# Patient Record
Sex: Male | Born: 1960
Health system: Southern US, Community
[De-identification: ages and names within clinical notes are randomized; demographics above are authoritative.]

## PROBLEM LIST (undated history)

## (undated) DIAGNOSIS — R519 Headache, unspecified: Secondary | ICD-10-CM

## (undated) DIAGNOSIS — J449 Chronic obstructive pulmonary disease, unspecified: Secondary | ICD-10-CM

## (undated) DIAGNOSIS — J3489 Other specified disorders of nose and nasal sinuses: Secondary | ICD-10-CM

## (undated) DIAGNOSIS — T8859XA Other complications of anesthesia, initial encounter: Secondary | ICD-10-CM

## (undated) DIAGNOSIS — M1990 Unspecified osteoarthritis, unspecified site: Secondary | ICD-10-CM

## (undated) HISTORY — PX: NO PAST SURGERIES: SHX2092

---

## 2016-02-26 DIAGNOSIS — T754XXA Electrocution, initial encounter: Secondary | ICD-10-CM

## 2016-02-26 HISTORY — DX: Electrocution, initial encounter: T75.4XXA

## 2018-02-02 ENCOUNTER — Other Ambulatory Visit: Payer: Self-pay | Admitting: Student

## 2018-02-02 DIAGNOSIS — R1031 Right lower quadrant pain: Secondary | ICD-10-CM

## 2018-02-03 ENCOUNTER — Ambulatory Visit
Admission: RE | Admit: 2018-02-03 | Discharge: 2018-02-03 | Disposition: A | Discharge status: Home / Self Care | Payer: No Typology Code available for payment source | Source: Ambulatory Visit | Attending: Student | Admitting: Student

## 2018-02-03 DIAGNOSIS — R1031 Right lower quadrant pain: Secondary | ICD-10-CM | POA: Diagnosis present

## 2018-02-13 ENCOUNTER — Other Ambulatory Visit: Payer: Self-pay | Admitting: General Surgery

## 2018-02-13 DIAGNOSIS — R1031 Right lower quadrant pain: Secondary | ICD-10-CM

## 2018-02-20 ENCOUNTER — Ambulatory Visit: Payer: No Typology Code available for payment source

## 2018-02-26 ENCOUNTER — Ambulatory Visit
Admission: RE | Admit: 2018-02-26 | Discharge: 2018-02-26 | Disposition: A | Discharge status: Home / Self Care | Payer: Commercial Managed Care - HMO | Source: Ambulatory Visit | Attending: General Surgery | Admitting: General Surgery

## 2018-02-26 DIAGNOSIS — R1031 Right lower quadrant pain: Secondary | ICD-10-CM | POA: Insufficient documentation

## 2019-07-17 IMAGING — US US EXTREM LOW*R* LIMITED
1 series · 14 of 25 positions shown · non-contrast
Comparison: None.

CLINICAL DATA: Right groin/inguinal pain for the past 4 months.

EXAM:
ULTRASOUND RIGHT LOWER EXTREMITY LIMITED
TECHNIQUE: Ultrasound examination of the lower extremity soft tissues was
performed in the area of clinical concern.

[Series 1: us extrem low*right* limited · 0.06mm/px · 14 of 36 slices shown]
[im 1/36]
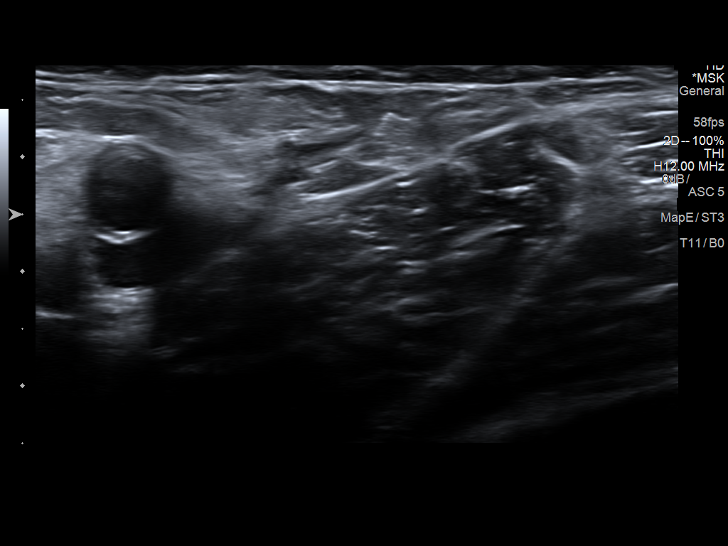
[im 3/36]
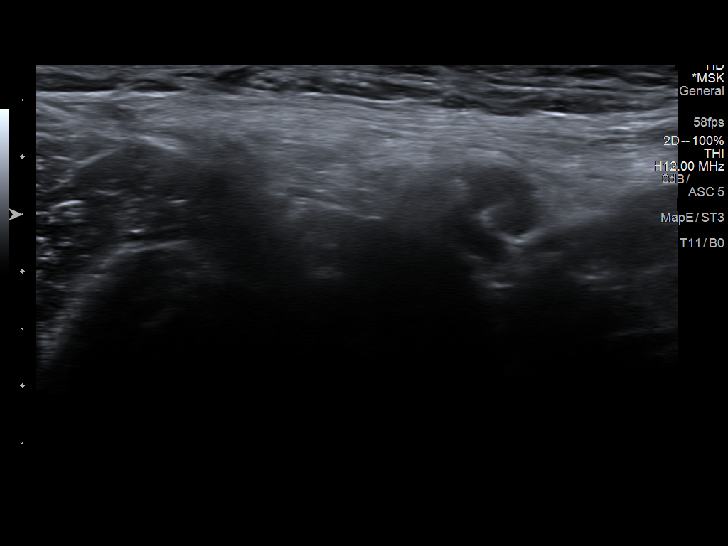
[im 6/36]
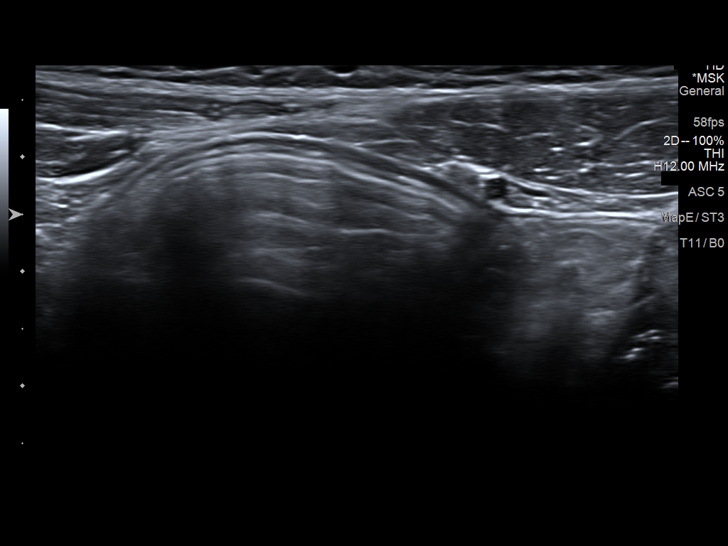
[im 9/36]
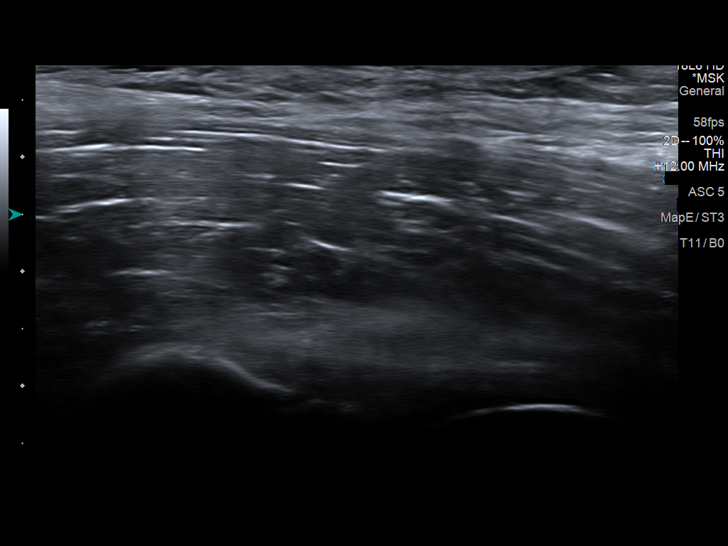
[im 12/36]
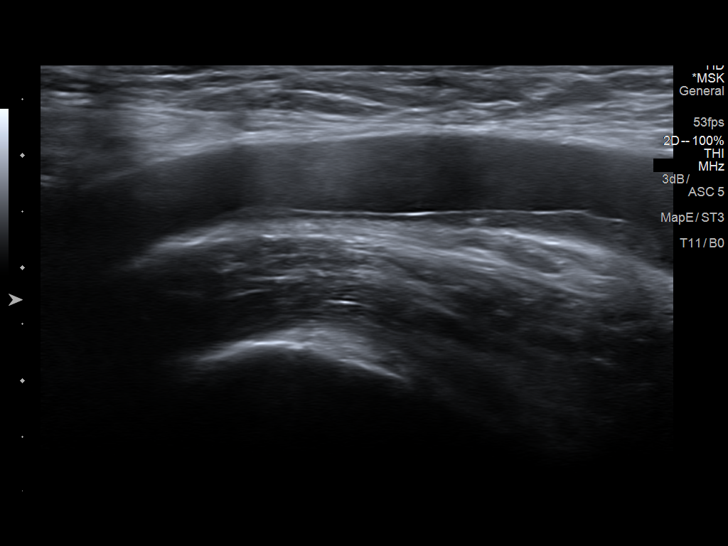
[im 14/36]
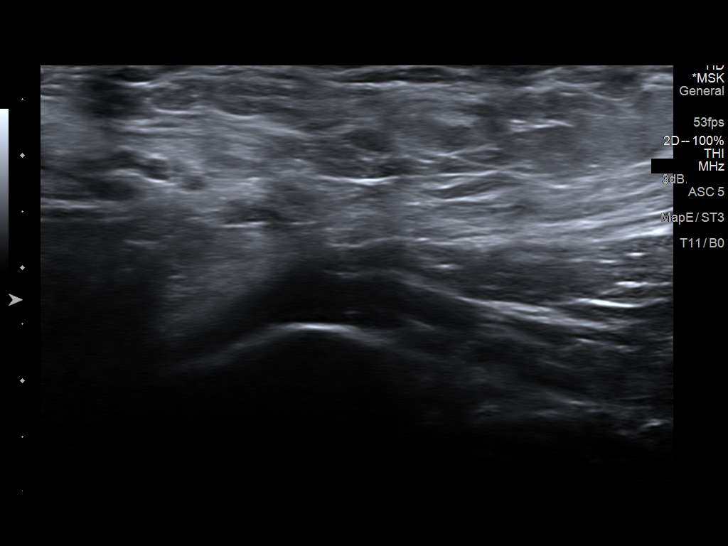
[im 17/36]
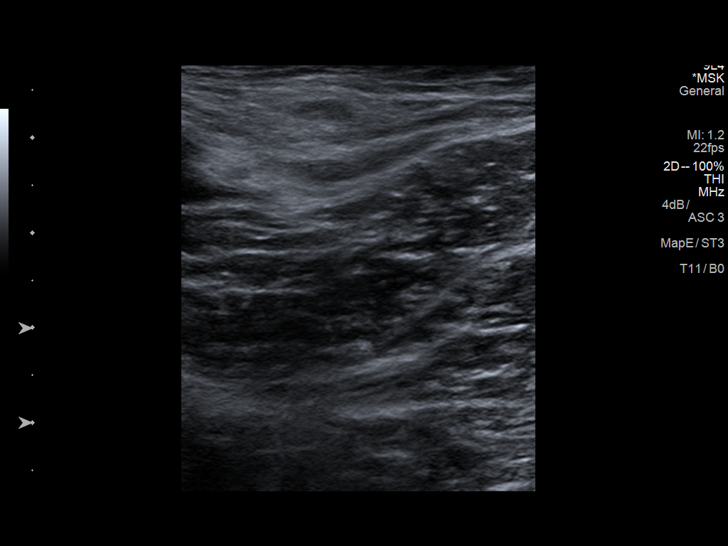
[im 19/36]
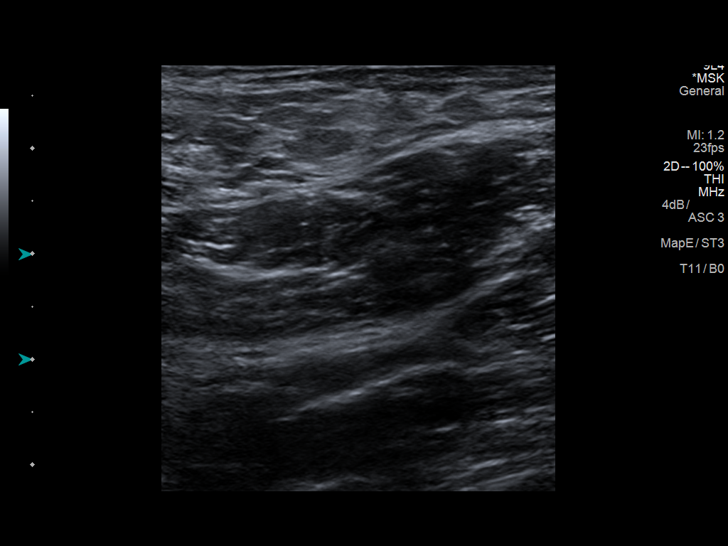
[im 22/36]
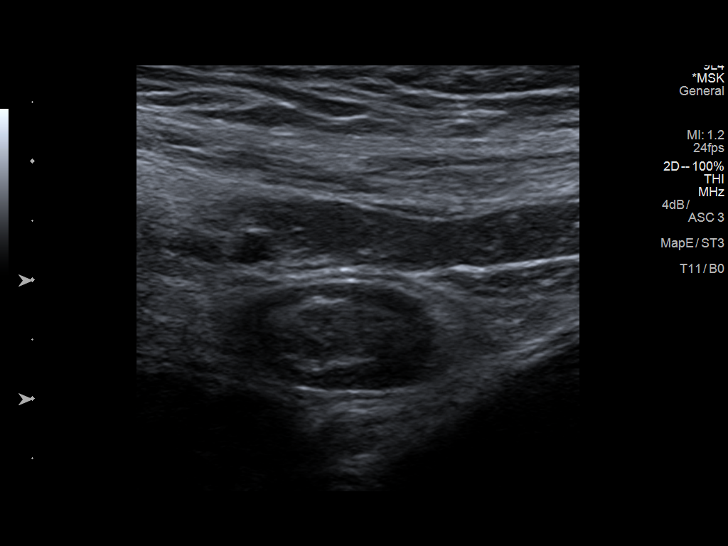
[im 24/36]
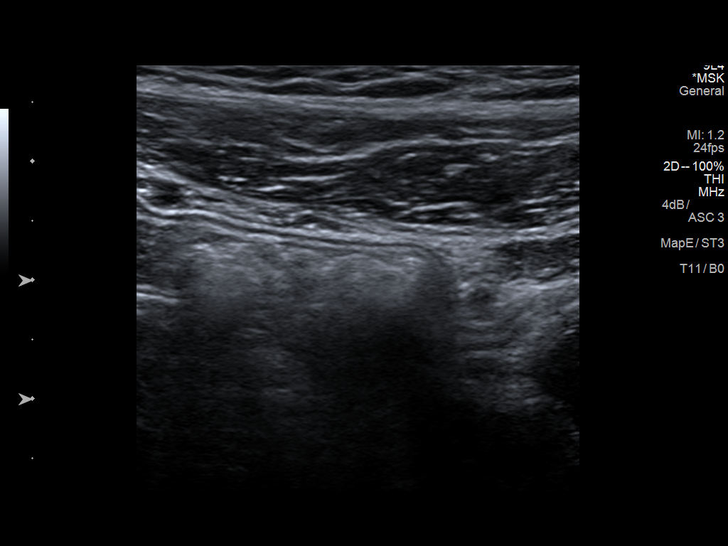
[im 27/36]
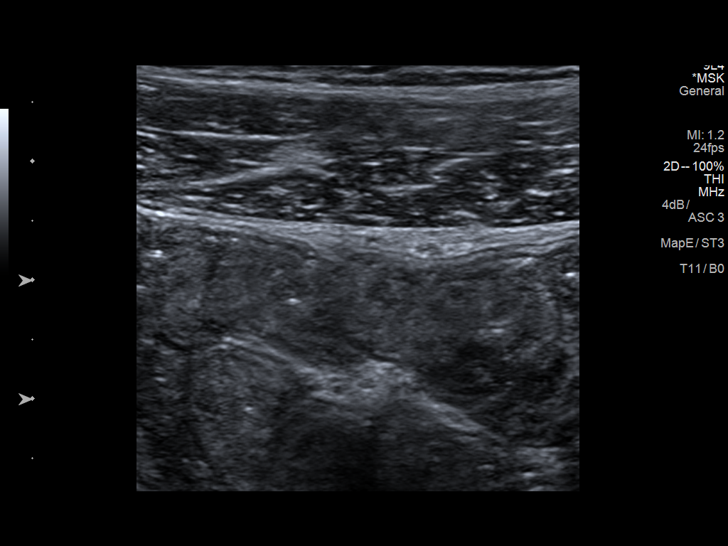
[im 30/36]
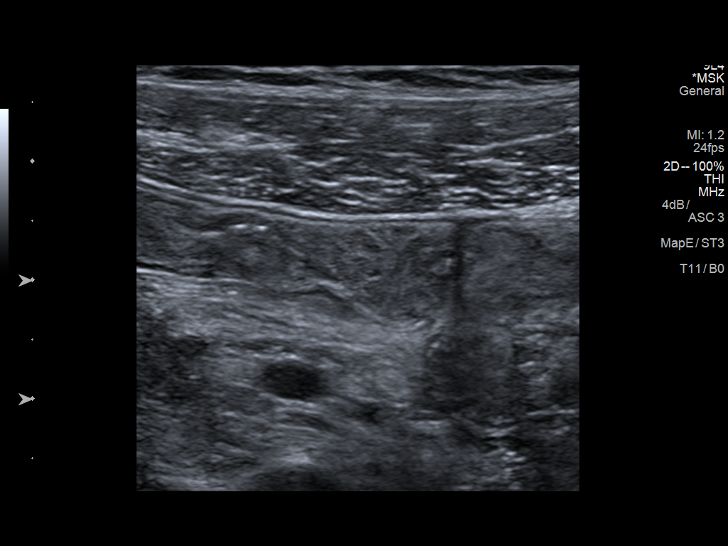
[im 33/36]
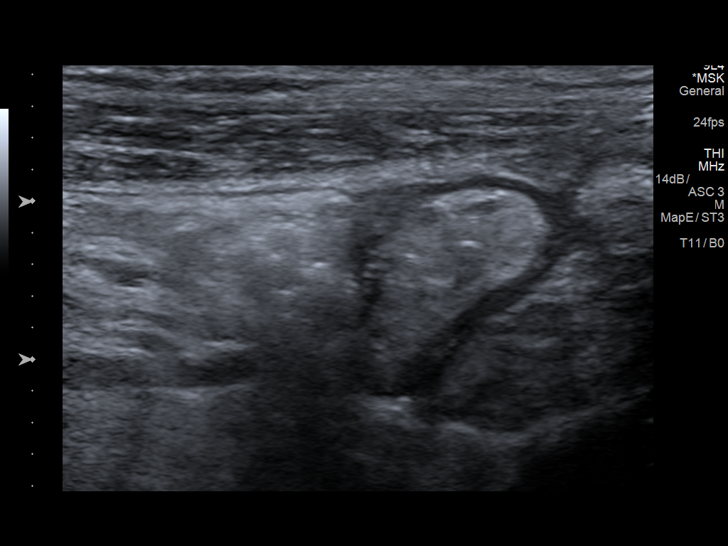
[im 36/36]
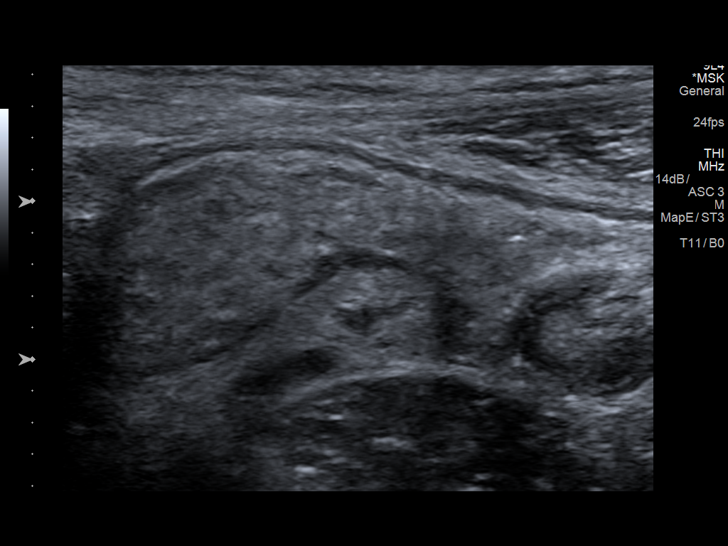

[14 of 25 positions shown; findings below may reference images not displayed]

FINDINGS: On the images, there is a suggestion of a right inguinal hernia
containing bowel. However, the technologist reported that the groin
had a normal appearance at real-time with no hernia seen. Normal
appearing right inguinal lymph nodes are demonstrated. A small
amount of atheromatous plaque and calcification is noted in right
common femoral artery.
IMPRESSION: 1. No sonographically visible hernia. If a hernia remains a clinical
concern, a pelvis CT without contrast would be recommended.
2. No visible mass or adenopathy.
3. Right common femoral artery atheromatous changes.

## 2019-08-27 ENCOUNTER — Other Ambulatory Visit: Payer: Self-pay | Admitting: Otolaryngology

## 2019-08-27 DIAGNOSIS — R221 Localized swelling, mass and lump, neck: Secondary | ICD-10-CM

## 2019-09-10 ENCOUNTER — Other Ambulatory Visit: Payer: Self-pay

## 2019-09-10 ENCOUNTER — Ambulatory Visit
Admission: RE | Admit: 2019-09-10 | Discharge: 2019-09-10 | Disposition: A | Discharge status: Home / Self Care | Payer: BC Managed Care – PPO | Source: Ambulatory Visit | Attending: Otolaryngology | Admitting: Otolaryngology

## 2019-09-10 DIAGNOSIS — R221 Localized swelling, mass and lump, neck: Secondary | ICD-10-CM | POA: Insufficient documentation

## 2019-09-10 MED ORDER — IOHEXOL 300 MG/ML  SOLN
75.0000 mL | Freq: Once | INTRAMUSCULAR | Status: AC | PRN
  Administered 2019-09-10: 75 mL via INTRAVENOUS

## 2019-09-15 ENCOUNTER — Other Ambulatory Visit: Payer: Self-pay | Admitting: Otolaryngology

## 2019-09-15 DIAGNOSIS — K119 Disease of salivary gland, unspecified: Secondary | ICD-10-CM

## 2019-09-21 ENCOUNTER — Other Ambulatory Visit: Payer: Self-pay | Admitting: Physician Assistant

## 2019-09-22 ENCOUNTER — Other Ambulatory Visit: Payer: Self-pay

## 2019-09-22 ENCOUNTER — Ambulatory Visit
Admission: RE | Admit: 2019-09-22 | Discharge: 2019-09-22 | Disposition: A | Discharge status: Home / Self Care | Payer: BC Managed Care – PPO | Source: Ambulatory Visit | Attending: Otolaryngology | Admitting: Otolaryngology

## 2019-09-22 DIAGNOSIS — K118 Other diseases of salivary glands: Secondary | ICD-10-CM | POA: Insufficient documentation

## 2019-09-22 DIAGNOSIS — K119 Disease of salivary gland, unspecified: Secondary | ICD-10-CM

## 2019-09-22 MED ORDER — SODIUM CHLORIDE 0.9 % IV SOLN: INTRAVENOUS | Status: DC

## 2019-09-22 NOTE — Discharge Instructions (Signed)

## 2019-09-22 NOTE — Procedures (Signed)
Interventional Radiology Procedure Note  Procedure:  US guided right parotid mass FNA.  Findings: 4 passes determined to be diagnostic.   Complications: None  Recommendations:  - Ok to shower tomorrow - Do not submerge for 7 days - Routine care - dc home now   Signed,  Dulcy Fanny. Earleen Newport, DO

## 2019-09-24 LAB — CYTOLOGY - NON PAP

## 2019-10-18 ENCOUNTER — Encounter
Admission: RE | Admit: 2019-10-18 | Discharge: 2019-10-18 | Disposition: A | Discharge status: Home / Self Care | Payer: BC Managed Care – PPO | Source: Ambulatory Visit | Attending: Otolaryngology | Admitting: Otolaryngology

## 2019-10-18 ENCOUNTER — Other Ambulatory Visit: Payer: Self-pay

## 2019-10-18 DIAGNOSIS — Z01818 Encounter for other preprocedural examination: Secondary | ICD-10-CM | POA: Diagnosis not present

## 2019-10-18 DIAGNOSIS — J449 Chronic obstructive pulmonary disease, unspecified: Secondary | ICD-10-CM | POA: Insufficient documentation

## 2019-10-18 HISTORY — DX: Other complications of anesthesia, initial encounter: T88.59XA

## 2019-10-18 HISTORY — DX: Chronic obstructive pulmonary disease, unspecified: J44.9

## 2019-10-18 HISTORY — DX: Other specified disorders of nose and nasal sinuses: J34.89

## 2019-10-18 HISTORY — DX: Unspecified osteoarthritis, unspecified site: M19.90

## 2019-10-18 HISTORY — DX: Headache, unspecified: R51.9

## 2019-10-18 NOTE — Patient Instructions (Addendum)
Your procedure is scheduled on: 10-27-19 [facility] Report to Same Day Surgery 2nd floor medical mall [facility] Entrance-take elevator on left to 2nd floor.  Check in with surgery information desk.) To find out your arrival time please call [phone] between 1PM - 3PM on 10-26-19 TUESDAY  Remember: Instructions that are not followed completely may result in serious medical risk, up to and including death, or upon the discretion of your surgeon and anesthesiologist your surgery may need to be rescheduled.    _x___ 1. Do not eat food after midnight the night before your procedure. NO GUM OR CANDY AFTER MIDNIGHT. You may drink clear liquids up to 2 hours before you are scheduled to arrive at the hospital for your procedure.  Do not drink clear liquids within 2 hours of your scheduled arrival to the hospital.  Clear liquids include  --Water or Apple juice without pulp  --Gatorade  --Black Coffee or Clear Tea (No milk, no creamers, do not add anything to the coffee or Tea-OK TO ADD SUGAR)      __x__ 2. No Alcohol for 24 hours before or after surgery.   __x__3. No Smoking or e-cigarettes for 24 prior to surgery.  Do not use any chewable tobacco products for at least 6 hour prior to surgery   ____  4. Bring all medications with you on the day of surgery if instructed.    __x__ 5. Notify your doctor if there is any change in your medical condition     (cold, fever, infections).    x___6. On the morning of surgery brush your teeth with toothpaste and water.  You may rinse your mouth with mouth wash if you wish.  Do not swallow any toothpaste or mouthwash.   Do not wear jewelry, make-up, hairpins, clips or nail polish.  Do not wear lotions, powders, or perfumes.   Do not shave 48 hours prior to surgery. Men may shave face and neck.  Do not bring valuables to the hospital.    [facility] is not responsible for any belongings or valuables.               Contacts, dentures or bridgework may  not be worn into surgery.  Leave your suitcase in the car. After surgery it may be brought to your room.  For patients admitted to the hospital, discharge time is determined by your treatment team.  _  Patients discharged the day of surgery will not be allowed to drive home.  You will need someone to drive you home and stay with you the night of your procedure.    Please read over the following fact sheets that you were given:   [facility] Preparing for Surgery   _x___ TAKE THE FOLLOWING MEDICATION THE MORNING OF SURGERY WITH A SMALL SIP OF WATER. These include:  1. MUCINEX (GUAIFNESEN)  2.  3.  4.  5.  6.  ____Fleets enema or Magnesium Citrate as directed.   _x___ Use CHG Soap as directed on instruction sheet   _X___ Use inhalers on the day of surgery and bring to hospital day of [place]   ____ Stop Metformin and Janumet 2 days prior to surgery.    ____ Take 1/2 of usual insulin dose the night before surgery and none on the morning surgery.   ____ Follow recommendations from Cardiologist, Pulmonologist or PCP regarding stopping Aspirin, Coumadin, Plavix ,Eliquis, Effient, or Pradaxa,  and Pletal.  X____Stop Anti-inflammatories such as Advil, Aleve, Ibuprofen, Motrin, Naproxen, Naprosyn, Goodies powders or aspirin products NOW- OK to take Tylenol   ____ Stop supplements until after surgery   ____ Bring C-Pap to the hospital.

## 2019-10-19 ENCOUNTER — Encounter
Admission: RE | Admit: 2019-10-19 | Discharge: 2019-10-19 | Disposition: A | Discharge status: Home / Self Care | Payer: BC Managed Care – PPO | Source: Ambulatory Visit | Attending: Otolaryngology | Admitting: Otolaryngology

## 2019-10-19 DIAGNOSIS — Z01810 Encounter for preprocedural cardiovascular examination: Secondary | ICD-10-CM | POA: Diagnosis present

## 2019-10-19 DIAGNOSIS — Z01812 Encounter for preprocedural laboratory examination: Secondary | ICD-10-CM | POA: Insufficient documentation

## 2019-10-19 DIAGNOSIS — J449 Chronic obstructive pulmonary disease, unspecified: Secondary | ICD-10-CM | POA: Insufficient documentation

## 2019-10-19 LAB — CBC
HCT: 44.3 % (ref 39.0–52.0)
Hemoglobin: 15.8 g/dL (ref 13.0–17.0)
MCH: 32.8 pg (ref 26.0–34.0)
MCHC: 35.7 g/dL (ref 30.0–36.0)
MCV: 91.9 fL (ref 80.0–100.0)
Platelets: 229 10*3/uL (ref 150–400)
RBC: 4.82 MIL/uL (ref 4.22–5.81)
RDW: 13.1 % (ref 11.5–15.5)
WBC: 6.6 10*3/uL (ref 4.0–10.5)
nRBC: 0 % (ref 0.0–0.2)

## 2019-10-25 ENCOUNTER — Other Ambulatory Visit: Payer: Self-pay

## 2019-10-25 ENCOUNTER — Other Ambulatory Visit
Admission: RE | Admit: 2019-10-25 | Discharge: 2019-10-25 | Disposition: A | Discharge status: Home / Self Care | Payer: BC Managed Care – PPO | Source: Ambulatory Visit | Attending: Otolaryngology | Admitting: Otolaryngology

## 2019-10-25 DIAGNOSIS — Z01812 Encounter for preprocedural laboratory examination: Secondary | ICD-10-CM | POA: Diagnosis not present

## 2019-10-25 DIAGNOSIS — Z20822 Contact with and (suspected) exposure to covid-19: Secondary | ICD-10-CM | POA: Insufficient documentation

## 2019-10-25 LAB — SARS CORONAVIRUS 2 (TAT 6-24 HRS): SARS Coronavirus 2: NEGATIVE

## 2019-10-27 ENCOUNTER — Other Ambulatory Visit: Payer: Self-pay

## 2019-10-27 ENCOUNTER — Ambulatory Visit: Payer: BC Managed Care – PPO | Admitting: Anesthesiology

## 2019-10-27 ENCOUNTER — Encounter: Payer: Self-pay | Admitting: Otolaryngology

## 2019-10-27 ENCOUNTER — Ambulatory Visit
Admission: RE | Admit: 2019-10-27 | Discharge: 2019-10-27 | Disposition: A | Discharge status: Home / Self Care | Payer: BC Managed Care – PPO | Source: Ambulatory Visit | Attending: Otolaryngology | Admitting: Otolaryngology

## 2019-10-27 ENCOUNTER — Encounter
Admission: RE | Disposition: A | Discharge status: Home / Self Care | Payer: Self-pay | Source: Ambulatory Visit | Attending: Otolaryngology

## 2019-10-27 DIAGNOSIS — J449 Chronic obstructive pulmonary disease, unspecified: Secondary | ICD-10-CM | POA: Diagnosis not present

## 2019-10-27 DIAGNOSIS — G43909 Migraine, unspecified, not intractable, without status migrainosus: Secondary | ICD-10-CM | POA: Diagnosis not present

## 2019-10-27 DIAGNOSIS — F17200 Nicotine dependence, unspecified, uncomplicated: Secondary | ICD-10-CM | POA: Diagnosis not present

## 2019-10-27 DIAGNOSIS — D110 Benign neoplasm of parotid gland: Secondary | ICD-10-CM | POA: Diagnosis not present

## 2019-10-27 DIAGNOSIS — M1990 Unspecified osteoarthritis, unspecified site: Secondary | ICD-10-CM | POA: Insufficient documentation

## 2019-10-27 DIAGNOSIS — Z79899 Other long term (current) drug therapy: Secondary | ICD-10-CM | POA: Insufficient documentation

## 2019-10-27 DIAGNOSIS — D490 Neoplasm of unspecified behavior of digestive system: Secondary | ICD-10-CM | POA: Diagnosis present

## 2019-10-27 HISTORY — PX: PAROTIDECTOMY: SHX2163

## 2019-10-27 SURGERY — EXCISION, PAROTID GLAND
Anesthesia: General | Laterality: Right

## 2019-10-27 MED ORDER — REMIFENTANIL HCL 1 MG IV SOLR
INTRAVENOUS | Status: AC
  Filled 2019-10-27: qty 1000

## 2019-10-27 MED ORDER — LIDOCAINE-EPINEPHRINE (PF) 1 %-1:200000 IJ SOLN
INTRAMUSCULAR | Status: AC
  Filled 2019-10-27: qty 30

## 2019-10-27 MED ORDER — PROPOFOL 500 MG/50ML IV EMUL
INTRAVENOUS | Status: AC
  Filled 2019-10-27: qty 50

## 2019-10-27 MED ORDER — ONDANSETRON HCL 4 MG/2ML IJ SOLN: 4.0000 mg | Freq: Once | INTRAMUSCULAR | Status: DC | PRN

## 2019-10-27 MED ORDER — REMIFENTANIL HCL 1 MG IV SOLR
INTRAVENOUS | Status: AC
Start: 2019-10-27 — End: ?
  Filled 2019-10-27: qty 1000

## 2019-10-27 MED ORDER — DEXAMETHASONE SODIUM PHOSPHATE 10 MG/ML IJ SOLN
INTRAMUSCULAR | Status: AC
  Filled 2019-10-27: qty 1

## 2019-10-27 MED ORDER — MIDAZOLAM HCL 2 MG/2ML IJ SOLN
INTRAMUSCULAR | Status: DC | PRN
  Administered 2019-10-27: 2 mg via INTRAVENOUS

## 2019-10-27 MED ORDER — FENTANYL CITRATE (PF) 100 MCG/2ML IJ SOLN
INTRAMUSCULAR | Status: DC | PRN
Start: 2019-10-27 — End: 2019-10-27
  Administered 2019-10-27 (×2): 50 ug via INTRAVENOUS

## 2019-10-27 MED ORDER — SUCCINYLCHOLINE CHLORIDE 200 MG/10ML IV SOSY
PREFILLED_SYRINGE | INTRAVENOUS | Status: AC
  Filled 2019-10-27: qty 10

## 2019-10-27 MED ORDER — OXYCODONE HCL 5 MG/5ML PO SOLN: 5.0000 mg | Freq: Once | ORAL | Status: AC | PRN

## 2019-10-27 MED ORDER — OXYCODONE HCL 5 MG PO TABS
5.0000 mg | ORAL_TABLET | Freq: Once | ORAL | Status: AC | PRN
  Administered 2019-10-27: 5 mg via ORAL

## 2019-10-27 MED ORDER — SODIUM CHLORIDE (PF) 0.9 % IJ SOLN
INTRAMUSCULAR | Status: AC
  Filled 2019-10-27: qty 20

## 2019-10-27 MED ORDER — DEXMEDETOMIDINE HCL IN NACL 80 MCG/20ML IV SOLN
INTRAVENOUS | Status: AC
  Filled 2019-10-27: qty 20

## 2019-10-27 MED ORDER — FENTANYL CITRATE (PF) 100 MCG/2ML IJ SOLN
INTRAMUSCULAR | Status: AC
  Filled 2019-10-27: qty 2

## 2019-10-27 MED ORDER — PROPOFOL 10 MG/ML IV BOLUS
INTRAVENOUS | Status: AC
  Filled 2019-10-27: qty 20

## 2019-10-27 MED ORDER — SUCCINYLCHOLINE CHLORIDE 20 MG/ML IJ SOLN
INTRAMUSCULAR | Status: DC | PRN
  Administered 2019-10-27: 100 mg via INTRAVENOUS

## 2019-10-27 MED ORDER — KETAMINE HCL 50 MG/ML IJ SOLN
INTRAMUSCULAR | Status: AC
  Filled 2019-10-27: qty 10

## 2019-10-27 MED ORDER — MIDAZOLAM HCL 2 MG/2ML IJ SOLN
INTRAMUSCULAR | Status: AC
  Filled 2019-10-27: qty 2

## 2019-10-27 MED ORDER — PROPOFOL 500 MG/50ML IV EMUL
INTRAVENOUS | Status: DC | PRN
  Administered 2019-10-27: 150 ug/kg/min via INTRAVENOUS

## 2019-10-27 MED ORDER — EPHEDRINE SULFATE 50 MG/ML IJ SOLN
INTRAMUSCULAR | Status: DC | PRN
  Administered 2019-10-27: 5 mg via INTRAVENOUS
  Administered 2019-10-27 (×2): 10 mg via INTRAVENOUS
  Administered 2019-10-27: 5 mg via INTRAVENOUS

## 2019-10-27 MED ORDER — FAMOTIDINE 20 MG PO TABS: 20.0000 mg | ORAL_TABLET | Freq: Once | ORAL | Status: DC

## 2019-10-27 MED ORDER — PHENYLEPHRINE HCL (PRESSORS) 10 MG/ML IV SOLN
INTRAVENOUS | Status: DC | PRN
  Administered 2019-10-27: 100 ug via INTRAVENOUS
  Administered 2019-10-27: 80 ug via INTRAVENOUS

## 2019-10-27 MED ORDER — PROPOFOL 10 MG/ML IV BOLUS
INTRAVENOUS | Status: DC | PRN
  Administered 2019-10-27: 200 mg via INTRAVENOUS
  Administered 2019-10-27: 50 mg via INTRAVENOUS

## 2019-10-27 MED ORDER — PROPOFOL 10 MG/ML IV BOLUS
INTRAVENOUS | Status: AC
  Filled 2019-10-27: qty 40

## 2019-10-27 MED ORDER — ORAL CARE MOUTH RINSE: 15.0000 mL | Freq: Once | OROMUCOSAL | Status: DC

## 2019-10-27 MED ORDER — PHENYLEPHRINE HCL (PRESSORS) 10 MG/ML IV SOLN
INTRAVENOUS | Status: AC
  Filled 2019-10-27: qty 1

## 2019-10-27 MED ORDER — ONDANSETRON HCL 4 MG/2ML IJ SOLN
INTRAMUSCULAR | Status: AC
  Filled 2019-10-27: qty 2

## 2019-10-27 MED ORDER — LIDOCAINE-EPINEPHRINE (PF) 1 %-1:200000 IJ SOLN
INTRAMUSCULAR | Status: DC | PRN
  Administered 2019-10-27: 6 mL

## 2019-10-27 MED ORDER — BACITRACIN ZINC 500 UNIT/GM EX OINT
TOPICAL_OINTMENT | CUTANEOUS | Status: DC | PRN
  Administered 2019-10-27: 1 via TOPICAL

## 2019-10-27 MED ORDER — GLYCOPYRROLATE 0.2 MG/ML IJ SOLN
INTRAMUSCULAR | Status: AC
  Filled 2019-10-27: qty 1

## 2019-10-27 MED ORDER — LIDOCAINE HCL (PF) 2 % IJ SOLN
INTRAMUSCULAR | Status: AC
  Filled 2019-10-27: qty 5

## 2019-10-27 MED ORDER — CHLORHEXIDINE GLUCONATE 0.12 % MT SOLN: 15.0000 mL | Freq: Once | OROMUCOSAL | Status: DC

## 2019-10-27 MED ORDER — LACTATED RINGERS IV SOLN: INTRAVENOUS | Status: DC

## 2019-10-27 MED ORDER — DEXAMETHASONE SODIUM PHOSPHATE 10 MG/ML IJ SOLN
INTRAMUSCULAR | Status: DC | PRN
  Administered 2019-10-27: 10 mg via INTRAVENOUS

## 2019-10-27 MED ORDER — GLYCOPYRROLATE 0.2 MG/ML IJ SOLN
INTRAMUSCULAR | Status: DC | PRN
  Administered 2019-10-27: .2 mg via INTRAVENOUS

## 2019-10-27 MED ORDER — ONDANSETRON HCL 4 MG/2ML IJ SOLN
INTRAMUSCULAR | Status: DC | PRN
  Administered 2019-10-27: 4 mg via INTRAVENOUS

## 2019-10-27 MED ORDER — FENTANYL CITRATE (PF) 100 MCG/2ML IJ SOLN: 25.0000 ug | INTRAMUSCULAR | Status: DC | PRN

## 2019-10-27 MED ORDER — OXYCODONE-ACETAMINOPHEN 10-325 MG PO TABS
1.0000 | ORAL_TABLET | Freq: Four times a day (QID) | ORAL | 0 refills | Status: AC | PRN
Start: 2019-10-27 — End: ?

## 2019-10-27 MED ORDER — LIDOCAINE HCL (CARDIAC) PF 100 MG/5ML IV SOSY
PREFILLED_SYRINGE | INTRAVENOUS | Status: DC | PRN
  Administered 2019-10-27: 100 mg via INTRAVENOUS

## 2019-10-27 MED ORDER — OXYCODONE HCL 5 MG PO TABS
ORAL_TABLET | ORAL | Status: AC
  Filled 2019-10-27: qty 1

## 2019-10-27 MED ORDER — DEXMEDETOMIDINE HCL 200 MCG/2ML IV SOLN
INTRAVENOUS | Status: DC | PRN
  Administered 2019-10-27: 8 ug via INTRAVENOUS

## 2019-10-27 MED ORDER — BACITRACIN ZINC 500 UNIT/GM EX OINT
TOPICAL_OINTMENT | CUTANEOUS | Status: AC
  Filled 2019-10-27: qty 28.35

## 2019-10-27 MED ORDER — ACETAMINOPHEN 10 MG/ML IV SOLN: 1000.0000 mg | Freq: Once | INTRAVENOUS | Status: DC | PRN

## 2019-10-27 MED ORDER — KETAMINE HCL 10 MG/ML IJ SOLN
INTRAMUSCULAR | Status: DC | PRN
  Administered 2019-10-27: 25 mg via INTRAVENOUS
  Administered 2019-10-27: 10 mg via INTRAVENOUS
  Administered 2019-10-27: 25 mg via INTRAVENOUS

## 2019-10-27 MED ORDER — REMIFENTANIL HCL 1 MG IV SOLR
INTRAVENOUS | Status: DC | PRN
  Administered 2019-10-27: .2 ug/kg/min via INTRAVENOUS

## 2019-10-27 SURGICAL SUPPLY — 40 items
ADH LQ OCL WTPRF AMP STRL LF (MISCELLANEOUS) ×1
ADHESIVE MASTISOL STRL (MISCELLANEOUS) ×3 IMPLANT
BLADE SURG 15 STRL LF DISP TIS (BLADE) ×1 IMPLANT
BLADE SURG 15 STRL SS (BLADE) ×3
BULB RESERV EVAC DRAIN JP 100C (MISCELLANEOUS) ×3 IMPLANT
CORD BIP STRL DISP 12FT (MISCELLANEOUS) ×3 IMPLANT
COVER WAND RF STERILE (DRAPES) ×3 IMPLANT
DRAIN JP 10F RND SILICONE (MISCELLANEOUS) ×3 IMPLANT
DRAPE MAG INST 16X20 L/F (DRAPES) ×3 IMPLANT
DRAPE SURG 17X11 SM STRL (DRAPES) ×6 IMPLANT
DRSG TEGADERM 2-3/8X2-3/4 SM (GAUZE/BANDAGES/DRESSINGS) ×12 IMPLANT
DRSG TEGADERM 4X4.75 (GAUZE/BANDAGES/DRESSINGS) ×3 IMPLANT
DRSG TELFA 4X3 1S NADH ST (GAUZE/BANDAGES/DRESSINGS) ×3 IMPLANT
ELECT EMG 20MM DUAL (MISCELLANEOUS) ×6
ELECT NEEDLE 20X.3 GREEN (MISCELLANEOUS) ×6
ELECT REM PT RETURN 9FT ADLT (ELECTROSURGICAL) ×3
ELECTRODE EMG 20MM DUAL (MISCELLANEOUS) ×2 IMPLANT
ELECTRODE NEEDLE 20X.3 GREEN (MISCELLANEOUS) ×2 IMPLANT
ELECTRODE REM PT RTRN 9FT ADLT (ELECTROSURGICAL) ×1 IMPLANT
FORCEPS JEWEL BIP 4-3/4 STR (INSTRUMENTS) ×3 IMPLANT
GAUZE SPONGE 4X4 12PLY STRL (GAUZE/BANDAGES/DRESSINGS) ×3 IMPLANT
GAUZE SPONGE 4X4 16PLY XRAY LF (GAUZE/BANDAGES/DRESSINGS) ×6 IMPLANT
GLOVE BIO SURGEON STRL SZ7.5 (GLOVE) ×6 IMPLANT
GLOVE SURG SYN 7.5  E (GLOVE) ×4
GLOVE SURG SYN 7.5 E (GLOVE) ×2 IMPLANT
GOWN STRL REUS W/ TWL LRG LVL3 (GOWN DISPOSABLE) ×3 IMPLANT
GOWN STRL REUS W/TWL LRG LVL3 (GOWN DISPOSABLE) ×9
HOOK STAY BLUNT/RETRACTOR 5M (MISCELLANEOUS) ×3 IMPLANT
KIT TURNOVER KIT A (KITS) ×3 IMPLANT
LABEL OR SOLS (LABEL) ×3 IMPLANT
PACK HEAD/NECK (MISCELLANEOUS) ×3 IMPLANT
PROBE MONO 100X0.75 ELECT 1.9M (MISCELLANEOUS) ×3 IMPLANT
SHEARS HARMONIC 9CM CVD (BLADE) ×3 IMPLANT
SPONGE KITTNER 5P (MISCELLANEOUS) ×9 IMPLANT
SUT PROLENE 5 0 PS 3 (SUTURE) ×3 IMPLANT
SUT SILK 2 0 (SUTURE) ×6
SUT SILK 2-0 18XBRD TIE 12 (SUTURE) ×2 IMPLANT
SUT SILK 4 0 (SUTURE) ×6
SUT SILK 4-0 18XBRD TIE 12 (SUTURE) ×2 IMPLANT
SUT VIC AB 4-0 RB1 18 (SUTURE) ×3 IMPLANT

## 2019-10-27 NOTE — Op Note (Signed)
10/27/2019  11:08 AM    Nicholas Phillips  [id number]   Pre-Op Diagnosis:  Right parotid neoplasm  Post-op Diagnosis: Right parotid neoplasm  Procedure:   Right Superficial Parotidectomy  Surgeon:  Riley Nearing  Assistant: Margaretha Sheffield  Anesthesia:  General endotracheal anesthesia  EBL:  Less than 37CH  Complications:  None  Findings: 2.5 cm right tail of parotid mass  Procedure: The patient was taken to the Operating Room and placed in the supine position.  After induction of general endotracheal anesthesia, the patient was turned 90 degrees and placed on a shoulder roll. The skin was injected along the proposed incision with 1% lidocaine with epinephrine, 1:200,000. The facial nerve monitor electrodes were placed in the usual fashion at the lower lip and brow on the same side of the procedure. Proper functioning of the nerve monitor was assessed. The area was then prepped and draped in the usual sterile fashion.   A 15 blade was then used to incise the skin from just in front of the right tragus, curving below the earlobe, and curving into the neck along an upper neck crease. The dissection was carried down to the subcutaneous tissues with the harmonic scalpel and through the platysma muscle, exposing the anterior belly of the sternocleidomastoid muscle. More superiorly the dissection proceeded along the tragal cartilage and down towards the mastoid tip. The dissection proceeded inferiorly to expose the digastric muscle. Next a skin flap was elevated just superficial to the fascia over the parotid gland, widely exposing the gland. Dissection then proceeded deeper, dissecting down to the region of the stylomastoid notch, dividing soft tissues with the Harmonic scalpel. The facial nerve was then identified at this point and confirmed with the nerve stimulator. Dissection then proceeded along the nerve anteriorly, identifying the pes and then dissecting along the superior and inferior  branches of the nerve, dividing parotid tissue above the nerve with the Harmonic scalpel. Intervening parotid tissue between branches was divided with the Harmonic scalpel. Once the dissection had proceeded anterior to the mass of abnormal parotid, the gland was then divided anterior to the mass, avoiding injury to the dissected nerve branches,  The wound was irrigated with saline and hemostasis obtained. A #10 TLS drain was placed through a separate stab incision in the skin posterior and inferior to the incision in the neck, and secured with a 34-0 Vicryl suture. The subcutaneous tissues were then closed with 4-0 Vicryl suture in an interrupted fashion. The skin was closed with 5-0 Prolene suture in a running locked stitch. Bacitracin ointment was applied to the wound.   The patient was then returned to the anesthesiologist for awakening, and was taken to the Recovery Room in stable condition.  Disposition:   PACU then discharge home  Plan: Discharge home with drain in place. F/u in office tomorrow for drain removal.   Riley Nearing 10/27/2019 11:08 AM

## 2019-10-27 NOTE — Discharge Instructions (Signed)

## 2019-10-27 NOTE — Transfer of Care (Cosign Needed)
Immediate Anesthesia Transfer of Care Note  Patient: Nicholas Phillips  Procedure(s) Performed: PAROTIDECTOMY (Right )  Patient Location: PACU  Anesthesia Type:General  Level of Consciousness: awake  Airway & Oxygen Therapy: Patient Spontanous Breathing and Patient connected to face mask oxygen  Post-op Assessment: Report given to RN, Post -op Vital signs reviewed and stable and Patient moving all extremities  Post vital signs: Reviewed and stable  Last Vitals:  Vitals Value Taken Time  BP 92/68 10/27/19 1121  Temp    Pulse 83 10/27/19 1123  Resp 14 10/27/19 1123  SpO2 98 % 10/27/19 1123  Vitals shown include unvalidated device data.  Last Pain:  Vitals:   10/27/19 0715  TempSrc: Tympanic  PainSc: 0-No pain         Complications: No complications documented.

## 2019-10-27 NOTE — Anesthesia Procedure Notes (Cosign Needed)
Procedure Name: Intubation Date/Time: 10/27/2019 8:50 AM Performed by: Arita Miss, MD Pre-anesthesia Checklist: Patient identified, Emergency Drugs available, Suction available and Patient being monitored Patient Re-evaluated:Patient Re-evaluated prior to induction Oxygen Delivery Method: Circle system utilized Preoxygenation: Pre-oxygenation with 100% oxygen Induction Type: IV induction Ventilation: Mask ventilation without difficulty Laryngoscope Size: Mac and 4 Grade View: Grade II Tube type: Oral Tube size: 7.0 mm Number of attempts: 1 Airway Equipment and Method: Stylet and Oral airway Placement Confirmation: ETT inserted through vocal cords under direct vision and positive ETCO2 Secured at: 22 cm Tube secured with: Tape Dental Injury: Teeth and Oropharynx as per pre-operative assessment

## 2019-10-27 NOTE — Anesthesia Preprocedure Evaluation (Signed)
Anesthesia Evaluation  Patient identified by MRN, date of birth, ID band Patient awake  General Assessment Comment:Never had anesthesia  Reviewed: Allergy & Precautions, NPO status , Patient's Chart, lab work & pertinent test results  History of Anesthesia Complications Negative for: history of anesthetic complications  Airway Mallampati: II  TM Distance: >3 FB Neck ROM: Full    Dental  (+) Teeth Intact, Poor Dentition, Dental Advisory Given   Pulmonary neg sleep apnea, COPD,  COPD inhaler, Current Smoker and Patient abstained from smoking.,  Albuterol sporadically, mild COPD never hospitalized   Pulmonary exam normal breath sounds clear to auscultation       Cardiovascular Exercise Tolerance: Good METS(-) hypertension(-) CAD and (-) Past MI negative cardio ROS  (-) dysrhythmias  Rhythm:Regular Rate:Normal - Systolic murmurs    Neuro/Psych  Headaches, negative psych ROS   GI/Hepatic neg GERD  ,(+)     (-) substance abuse  ,   Endo/Other  neg diabetes  Renal/GU negative Renal ROS     Musculoskeletal   Abdominal   Peds  Hematology   Anesthesia Other Findings Past Medical History: No date: Arthritis No date: Complication of anesthesia     Comment:  dental procedure-whatever was used made pt               tachycardic-pt unsure but will call dentist to find out No date: COPD (chronic obstructive pulmonary disease) ([place])     Comment:  MILD 2018: Electrical shock of hand No date: Headache     Comment:  OCC MIGRAINES No date: Sinus drainage  Reproductive/Obstetrics                             Anesthesia Physical Anesthesia Plan  ASA: II  Anesthesia Plan: General   Post-op Pain Management:    Induction: Intravenous  PONV Risk Score and Plan: 2 and Ondansetron, Dexamethasone, Propofol infusion, TIVA and Midazolam  Airway Management Planned: Oral ETT  Additional Equipment:  None  Intra-op Plan:   Post-operative Plan: Extubation in OR  Informed Consent: I have reviewed the patients History and Physical, chart, labs and discussed the procedure including the risks, benefits and alternatives for the proposed anesthesia with the patient or authorized representative who has indicated his/her understanding and acceptance.     Dental advisory given  Plan Discussed with: CRNA and Surgeon  Anesthesia Plan Comments: (Discussed risks of anesthesia with patient, including PONV, sore throat, lip/dental damage. Rare risks discussed as well, such as cardiorespiratory and neurological sequelae. Patient understands.)        Anesthesia Quick Evaluation

## 2019-10-27 NOTE — H&P (Signed)
History and physical reviewed and will be scanned in later. No change in medical status reported by the patient or family, appears stable for surgery. All questions regarding the procedure answered, and patient (or family if a child) expressed understanding of the procedure. ? ?Nicholas Phillips S [name] ?@[email or web address]@ ?

## 2019-10-27 NOTE — Anesthesia Postprocedure Evaluation (Signed)
Anesthesia Post Note  Patient: Nicholas Phillips  Procedure(s) Performed: PAROTIDECTOMY (Right )  Patient location during evaluation: PACU Anesthesia Type: General Level of consciousness: awake and alert Pain management: pain level controlled Vital Signs Assessment: post-procedure vital signs reviewed and stable Respiratory status: spontaneous breathing, nonlabored ventilation, respiratory function stable and patient connected to nasal cannula oxygen Cardiovascular status: blood pressure returned to baseline and stable Postop Assessment: no apparent nausea or vomiting Anesthetic complications: no   No complications documented.   Last Vitals:  Vitals:   10/27/19 1206 10/27/19 1220  BP: 118/70 124/62  Pulse:  66  Resp:  16  Temp:  (!) 36.3 C  SpO2:  98%    Last Pain:  Vitals:   10/27/19 1220  TempSrc: Temporal  PainSc: 0-No pain                 Arita Miss

## 2019-10-28 LAB — SURGICAL PATHOLOGY

## 2022-04-28 IMAGING — US US BIOPSY FNA W/ IMAGING
1 series · 12 of 12 positions shown · non-contrast
Comparison: none

INDICATION: 59-year-old male referred for biopsy of right parotid lesion

[Series 1: us biopsy fna w/ imaging · 0.06mm/px · 12 acquisitions, 12 frames shown]
[im 1/12]
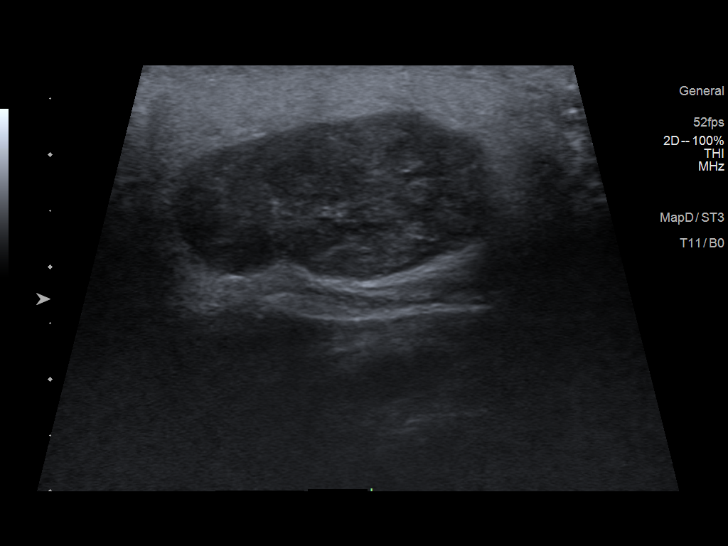
[im 2/12]
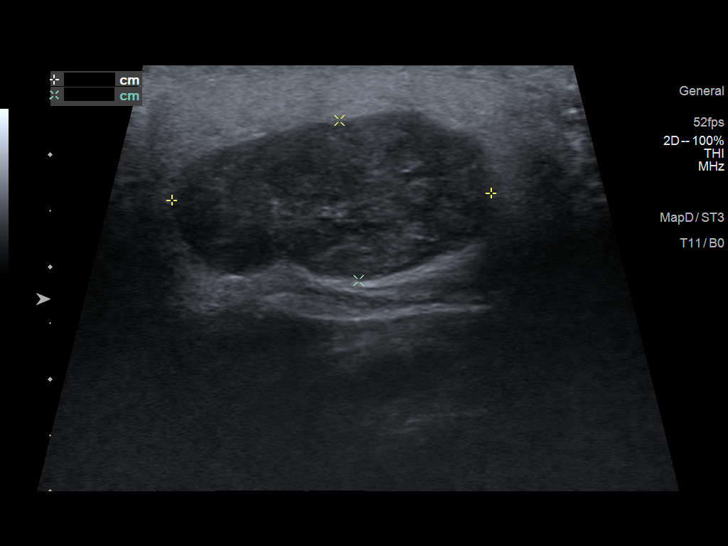
[im 3/12]
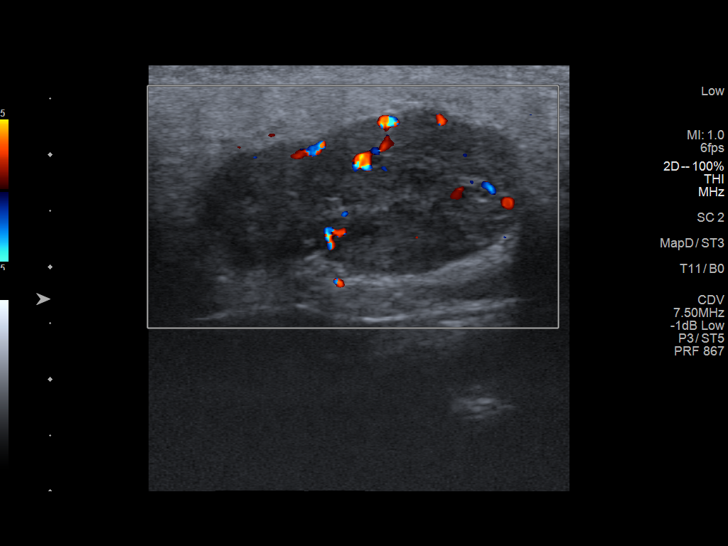
[im 4/12]
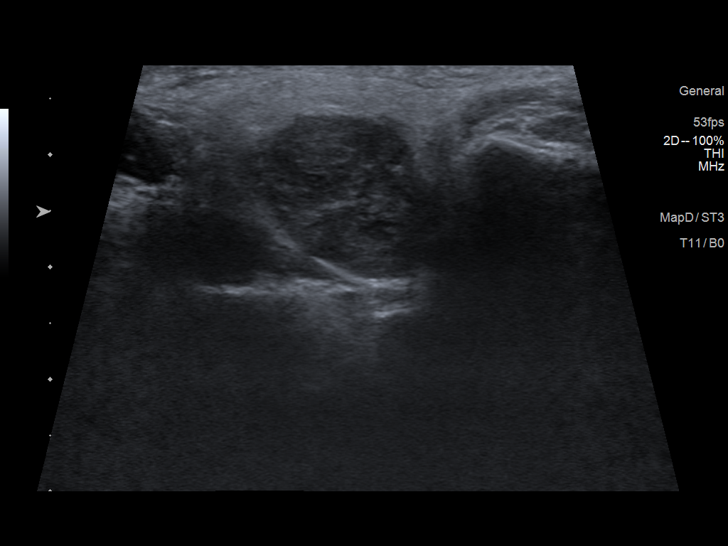
[im 5/12]
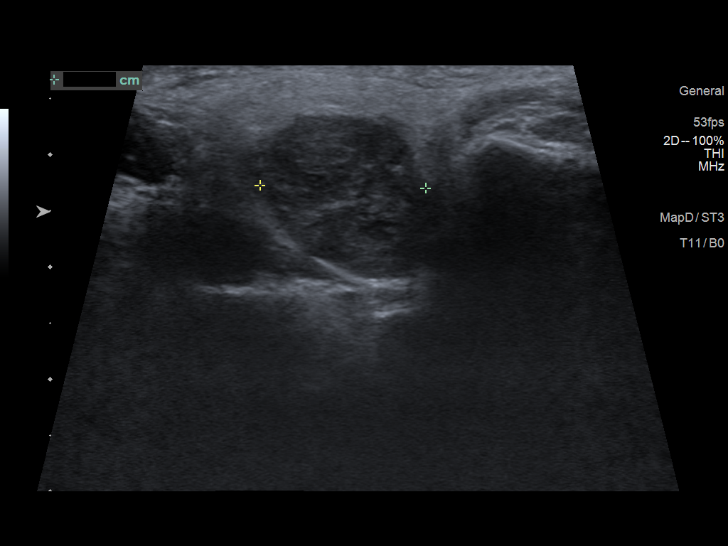
[im 6/12]
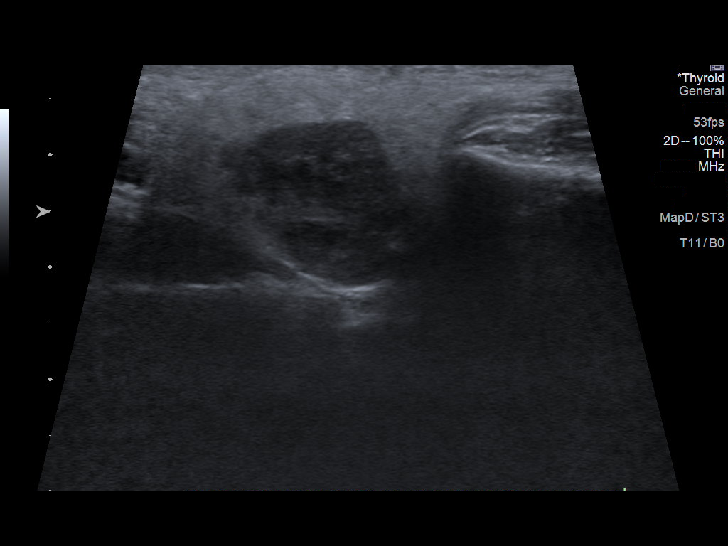
[im 7/12]
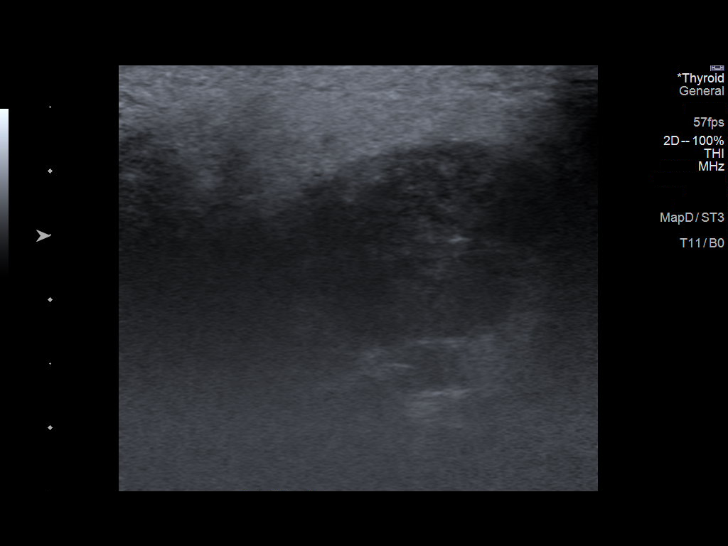
[im 8/12]
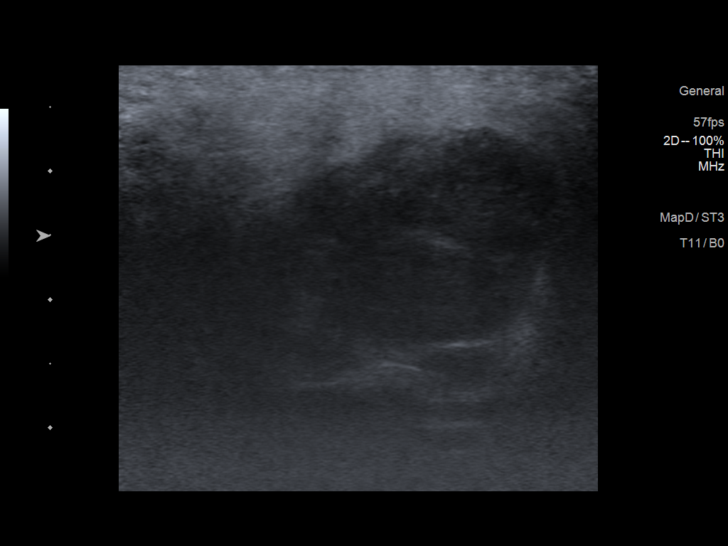
[im 9/12]
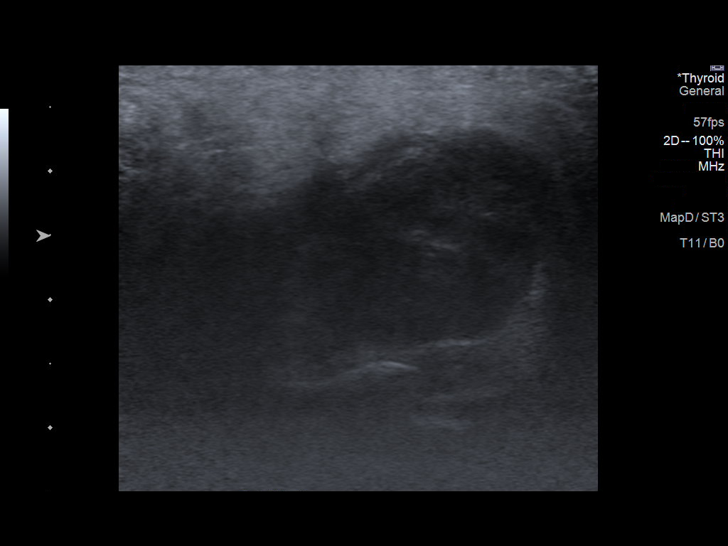
[im 10/12]
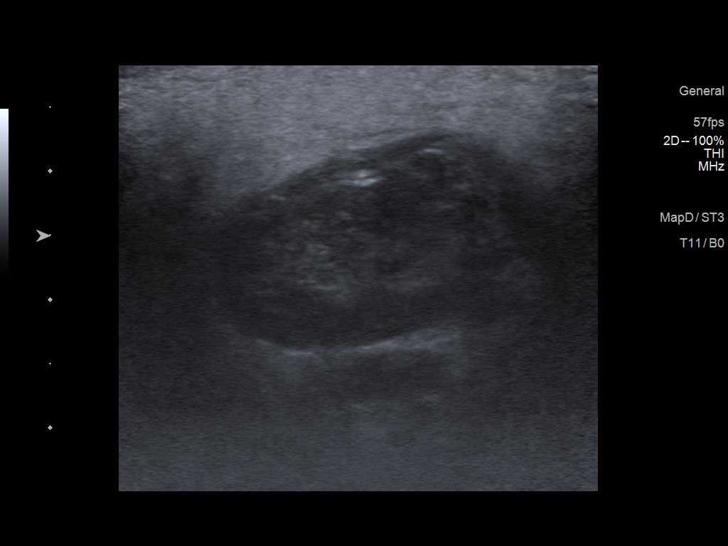
[im 11/12]
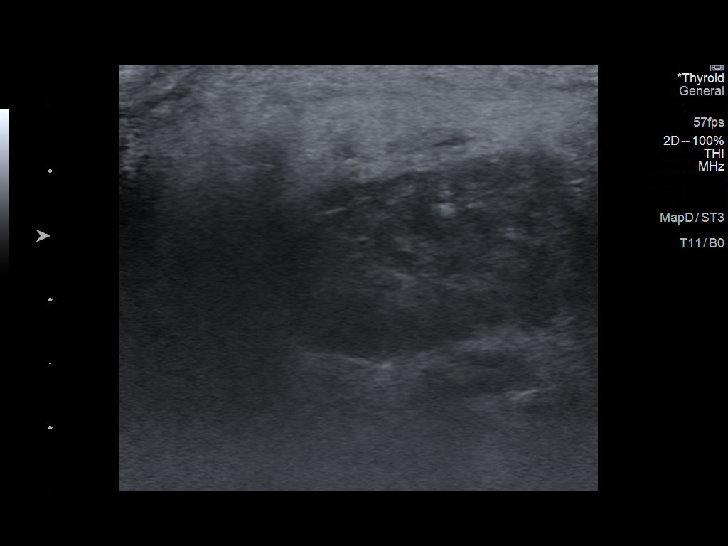
[im 12/12]
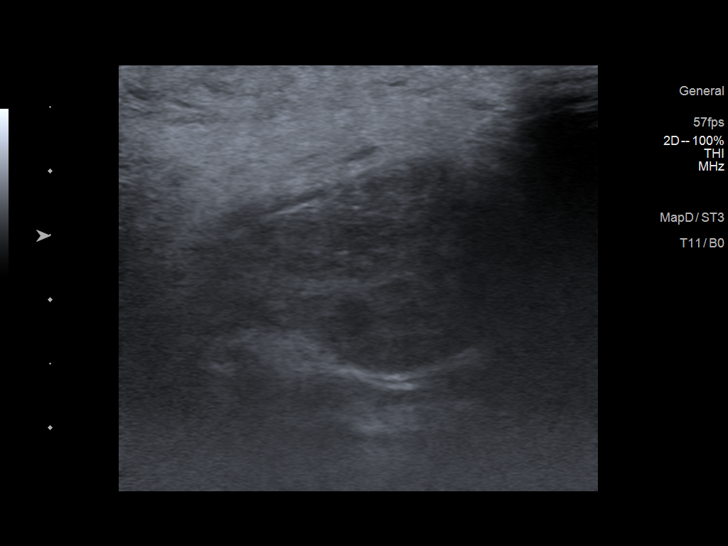

[12 of 12 positions shown; findings below may reference images not displayed]

EXAM:
ULTRASOUND-GUIDED FNA RIGHT PAROTID MASS

MEDICATIONS:
None.

ANESTHESIA/SEDATION:
None.

FLUOROSCOPY TIME:  None

COMPLICATIONS:
None

PROCEDURE:
Informed written consent was obtained from the patient after a
thorough discussion of the procedural risks, benefits and
alternatives. All questions were addressed. Maximal Sterile Barrier
Technique was utilized including caps, mask, sterile gowns, sterile
gloves, sterile drape, hand hygiene and skin antiseptic. A timeout
was performed prior to the initiation of the procedure

Ultrasound survey was performed with images stored and sent to PACs.

The right neck was prepped with chlorhexidine in a sterile fashion,
and a sterile drape was applied covering the operative field. A
sterile gown and sterile gloves were used for the procedure. Local
anesthesia was provided with 1% Lidocaine.

Ultrasound guidance was used to infiltrate the region with 1%
lidocaine for local anesthesia. Three separate 25 gauge fine needle
biopsy were then acquired of the right parotid nodule using
ultrasound guidance. Images were stored.

Slide preparation was performed. Slide was reviewed confirming
adequacy.

One final 23 gauge fine-needle biopsy was then performed using
ultrasound guidance.

Final image was stored after biopsy.

Patient tolerated the procedure well and remained hemodynamically
stable throughout.

No complications were encountered and no significant blood loss was
encounter
IMPRESSION: Status post ultrasound-guided right parotid mass FNA.

## 2023-01-22 ENCOUNTER — Emergency Department
Admission: EM | Admit: 2023-01-22 | Discharge: 2023-01-22 | Disposition: A | Discharge status: Home / Self Care | Payer: Self-pay | Attending: Emergency Medicine | Admitting: Emergency Medicine

## 2023-01-22 ENCOUNTER — Emergency Department: Payer: Self-pay

## 2023-01-22 ENCOUNTER — Other Ambulatory Visit: Payer: Self-pay

## 2023-01-22 DIAGNOSIS — H66001 Acute suppurative otitis media without spontaneous rupture of ear drum, right ear: Secondary | ICD-10-CM | POA: Insufficient documentation

## 2023-01-22 DIAGNOSIS — Z7982 Long term (current) use of aspirin: Secondary | ICD-10-CM | POA: Insufficient documentation

## 2023-01-22 DIAGNOSIS — R200 Anesthesia of skin: Secondary | ICD-10-CM | POA: Insufficient documentation

## 2023-01-22 DIAGNOSIS — R42 Dizziness and giddiness: Secondary | ICD-10-CM

## 2023-01-22 LAB — DIFFERENTIAL
Abs Immature Granulocytes: 0.02 10*3/uL (ref 0.00–0.07)
Basophils Absolute: 0.1 10*3/uL (ref 0.0–0.1)
Basophils Relative: 1 %
Eosinophils Absolute: 0.1 10*3/uL (ref 0.0–0.5)
Eosinophils Relative: 1 %
Immature Granulocytes: 0 %
Lymphocytes Relative: 26 %
Lymphs Abs: 2.2 10*3/uL (ref 0.7–4.0)
Monocytes Absolute: 0.8 10*3/uL (ref 0.1–1.0)
Monocytes Relative: 9 %
Neutro Abs: 5.2 10*3/uL (ref 1.7–7.7)
Neutrophils Relative %: 63 %

## 2023-01-22 LAB — COMPREHENSIVE METABOLIC PANEL
ALT: 18 U/L (ref 0–44)
AST: 21 U/L (ref 15–41)
Albumin: 3.9 g/dL (ref 3.5–5.0)
Alkaline Phosphatase: 56 U/L (ref 38–126)
Anion gap: 6 (ref 5–15)
BUN: 9 mg/dL (ref 6–20)
CO2: 24 mmol/L (ref 22–32)
Calcium: 8.9 mg/dL (ref 8.9–10.3)
Chloride: 105 mmol/L (ref 98–111)
Creatinine, Ser: 1.59 mg/dL — ABNORMAL HIGH (ref 0.61–1.24)
GFR, Estimated: 49 mL/min — ABNORMAL LOW (ref >60)
Glucose, Bld: 99 mg/dL (ref 70–99)
Potassium: 4.3 mmol/L (ref 3.5–5.1)
Sodium: 135 mmol/L (ref 135–145)
Total Bilirubin: 0.7 mg/dL (ref <1.2)
Total Protein: 6.9 g/dL (ref 6.5–8.1)

## 2023-01-22 LAB — PROTIME-INR
INR: 1 (ref 0.8–1.2)
Prothrombin Time: 13.7 s (ref 11.4–15.2)

## 2023-01-22 LAB — CBC
HCT: 44.5 % (ref 39.0–52.0)
Hemoglobin: 15.1 g/dL (ref 13.0–17.0)
MCH: 31.7 pg (ref 26.0–34.0)
MCHC: 33.9 g/dL (ref 30.0–36.0)
MCV: 93.5 fL (ref 80.0–100.0)
Platelets: 230 10*3/uL (ref 150–400)
RBC: 4.76 MIL/uL (ref 4.22–5.81)
RDW: 12.6 % (ref 11.5–15.5)
WBC: 8.4 10*3/uL (ref 4.0–10.5)
nRBC: 0 % (ref 0.0–0.2)

## 2023-01-22 LAB — APTT: aPTT: 32 s (ref 24–36)

## 2023-01-22 LAB — ETHANOL: Alcohol, Ethyl (B): <10 mg/dL (ref <10)

## 2023-01-22 MED ORDER — MECLIZINE HCL 25 MG PO TABS: 25.0000 mg | ORAL_TABLET | Freq: Three times a day (TID) | ORAL | 0 refills | Status: AC | PRN

## 2023-01-22 MED ORDER — AMOXICILLIN-POT CLAVULANATE 875-125 MG PO TABS: 1.0000 | ORAL_TABLET | Freq: Two times a day (BID) | ORAL | 0 refills | Status: AC

## 2023-01-22 MED ORDER — FLUTICASONE PROPIONATE 50 MCG/ACT NA SUSP: 2.0000 | Freq: Every day | NASAL | 0 refills | Status: AC

## 2023-01-22 MED ORDER — ASPIRIN 325 MG PO TBEC: 325.0000 mg | DELAYED_RELEASE_TABLET | Freq: Every day | ORAL | 0 refills | Status: AC

## 2023-01-22 MED ORDER — SODIUM CHLORIDE 0.9% FLUSH: 3.0000 mL | Freq: Once | INTRAVENOUS | Status: DC

## 2023-01-22 NOTE — ED Triage Notes (Signed)
Pt to ED for dizziness and numbness to right leg for approx one hour yesterday. Denies weakness/numbness currently. Ambulatory, steady gait. NAD noted. Reports intermittent dizziness with head movement

## 2023-01-22 NOTE — Discharge Instructions (Signed)
As we discussed, I typically would recommend obtaining an MRI for evaluation of possible small stroke or TIA.  Instead, lets start a full dose aspirin daily take this with food in the mornings.  Call your primary doctor or Dr. Clelia Croft to set up an appointment within the next week for additional imaging and TIA workup.  Do not take other aspirin, Advil, or NSAID medications while taking the aspirin.  For your ear pressure and dizziness, I have prescribed antibiotics as well as Flonase.  Use this as prescribed.  I have also given meclizine which can help with dizziness.

## 2023-01-22 NOTE — ED Provider Notes (Signed)
[facility] Provider Note    Event Date/Time   First MD Initiated Contact with Patient 01/22/23 1518     (approximate)   History   Dizziness   HPI  Nicholas Phillips is a 62 y.o. male with history of COPD here with dizziness and some transient right leg numbness.  The patient states that yesterday at work, he looked up.  He works at a [employer].  He states he began to feel somewhat dizzy and off balance.  He has a history of inner ear issues and has had similar symptoms.  He states has had sinus congestion and right ear pressure for the last 2 weeks or so.  He states that at the same time, he also felt like his right leg was somewhat numb.  He had been lifting and working throughout the day, and the symptoms resolved over several minutes.  Denies any weakness.  Denies any overt back pain.  No speech difficulty.  No vision changes.  No history of CVA or TIA.     Physical Exam   Triage Vital Signs: ED Triage Vitals  Encounter Vitals Group     BP 01/22/23 1244 (!) 150/90     Systolic BP Percentile --      Diastolic BP Percentile --      Pulse Rate 01/22/23 1244 62     Resp 01/22/23 1244 18     Temp 01/22/23 1239 98.3 F (36.8 C)     Temp src --      SpO2 01/22/23 1244 100 %     Weight 01/22/23 1243 148 lb (67.1 kg)     Height 01/22/23 1243 5\' 8"  (1.727 m)     Head Circumference --      Peak Flow --      Pain Score 01/22/23 1243 0     Pain Loc --      Pain Education --      Exclude from Growth Chart --     Most recent vital signs: Vitals:   01/22/23 1239 01/22/23 1244  BP:  (!) 150/90  Pulse:  62  Resp:  18  Temp: 98.3 F (36.8 C)   SpO2:  100%     General: Awake, no distress.  CV:  Good peripheral perfusion.  Regular rate and rhythm.  No murmurs or rubs. Resp:  Normal work of breathing.  Lungs clear to auscultation bilaterally. Abd:  No distention.  Other:  Alert, oriented x 3.  Face is symmetric.  Pupils equal, round,  reactive.  No nystagmus.  Motor strength out of 5 bilateral upper and lower extremities.  Normal sensation to light touch.  On HEENT exam, patient has moderate nasal rhinorrhea and congestion bilaterally.  Right tympanic membrane is full, bulging, mildly erythematous.  No mastoid tenderness.   ED Results / Procedures / Treatments   Labs (all labs ordered are listed, but only abnormal results are displayed) Labs Reviewed  COMPREHENSIVE METABOLIC PANEL - Abnormal; Notable for the following components:      Result Value   Creatinine, Ser 1.59 (*)    GFR, Estimated 49 (*)    All other components within normal limits  PROTIME-INR  APTT  CBC  DIFFERENTIAL  ETHANOL     EKG Sinus bradycardia, ventricular at 58.  PR 128, QRS 78, QTc 408.  No acute ST elevations repress for [place] of acute ischemia infarct.   RADIOLOGY CT head: No acute abnormality   I also independently reviewed and  agree with radiologist interpretations.   PROCEDURES:  Critical Care performed: No   MEDICATIONS ORDERED IN ED: Medications  sodium chloride flush (NS) 0.9 % injection 3 mL (3 mLs Intravenous Not Given 01/22/23 1620)     IMPRESSION / MDM / ASSESSMENT AND PLAN / ED COURSE  I reviewed the triage vital signs and the nursing notes.                              Differential diagnosis includes, but is not limited to, peripheral vertigo, otitis media, inner ear dysfunction, CVA, TIA, [place] no mass/lesion, intracranial hemorrhage  Patient's presentation is most consistent with acute presentation with potential threat to life or bodily function.  The patient is on the cardiac monitor to evaluate for evidence of arrhythmia and/or significant heart rate changes   62 year old well-appearing male here with dizziness and some transient right leg numbness.  Regarding his dizziness, clinically, high suspicion for otitis media/right inner ear dysfunction.  He has a long history of the same.  He has had  some ear pressure and tinnitus.  Will treat for otitis media.  No evidence of mastoiditis.  He has no focal neurological deficits.  He has no nystagmus.  Regarding his leg numbness, this is the only somewhat atypical syndrome.  He does work with heavy lifting and twisting and symptoms would suggest a possible transient paresthesia or neuropraxia.  Query possible lumbar radiculopathy.  In the setting of his dizziness, cannot definitively rule out a TIA, though this is less likely and he had no other accompanying symptoms.  I had a very long discussion with the patient regarding TIA evaluation including MRI, CT angio, and observation.  At this time, given that his symptoms were greater than 24 hours ago, transient, he does not wish to pursue this at this time.  He understands that without this imaging I am unable to tell him whether he had a stroke and to appropriately risk to notify him for medication.  We discussed alternatives.  Will start a full dose aspirin while he is following up with his PCP and neurology, treat him for his otitis, and discharged with good return precautions.   FINAL CLINICAL IMPRESSION(S) / ED DIAGNOSES   Final diagnoses:  Non-recurrent acute suppurative otitis media of right ear without spontaneous rupture of tympanic membrane  Dizziness     Rx / DC Orders   ED Discharge Orders          Ordered    amoxicillin-clavulanate (AUGMENTIN) 875-125 MG tablet  2 times daily        01/22/23 1610    aspirin EC 325 MG tablet  Daily        01/22/23 1610    meclizine (ANTIVERT) 25 MG tablet  3 times daily PRN        01/22/23 1610    fluticasone (FLONASE) 50 MCG/ACT nasal spray  Daily        01/22/23 1610             Note:  This document was prepared using Dragon voice recognition software and may include unintentional dictation errors.   Shaune Pollack, MD 01/22/23 [phone]

## 2023-01-31 ENCOUNTER — Other Ambulatory Visit: Payer: Self-pay | Admitting: Internal Medicine

## 2023-01-31 DIAGNOSIS — Z0000 Encounter for general adult medical examination without abnormal findings: Secondary | ICD-10-CM

## 2023-01-31 DIAGNOSIS — E782 Mixed hyperlipidemia: Secondary | ICD-10-CM

## 2023-02-13 ENCOUNTER — Ambulatory Visit
Admission: RE | Admit: 2023-02-13 | Discharge: 2023-02-13 | Disposition: A | Discharge status: Home / Self Care | Payer: Self-pay | Source: Ambulatory Visit | Attending: Internal Medicine | Admitting: Internal Medicine

## 2023-02-13 DIAGNOSIS — Z0000 Encounter for general adult medical examination without abnormal findings: Secondary | ICD-10-CM | POA: Insufficient documentation

## 2023-02-13 DIAGNOSIS — E782 Mixed hyperlipidemia: Secondary | ICD-10-CM | POA: Insufficient documentation
